# Patient Record
Sex: Male | Born: 1975 | Race: Black or African American | Hispanic: No | Marital: Married | Smoking: Former smoker
Health system: Southern US, Community
[De-identification: ages and names within clinical notes are randomized; demographics above are authoritative.]

## PROBLEM LIST (undated history)

## (undated) DIAGNOSIS — K219 Gastro-esophageal reflux disease without esophagitis: Secondary | ICD-10-CM

## (undated) DIAGNOSIS — E785 Hyperlipidemia, unspecified: Secondary | ICD-10-CM

## (undated) DIAGNOSIS — N289 Disorder of kidney and ureter, unspecified: Secondary | ICD-10-CM

## (undated) DIAGNOSIS — I10 Essential (primary) hypertension: Secondary | ICD-10-CM

---

## 2004-04-22 ENCOUNTER — Emergency Department: Payer: Self-pay | Admitting: Emergency Medicine

## 2012-03-20 ENCOUNTER — Emergency Department: Payer: Self-pay | Admitting: Emergency Medicine

## 2012-03-20 LAB — CBC
HGB: 13.8 g/dL (ref 13.0–18.0)
MCV: 91 fL (ref 80–100)
Platelet: 235 10*3/uL (ref 150–440)
RBC: 4.45 10*6/uL (ref 4.40–5.90)
RDW: 14.6 % — ABNORMAL HIGH (ref 11.5–14.5)
WBC: 6.1 10*3/uL (ref 3.8–10.6)

## 2012-03-20 LAB — TROPONIN I: Troponin-I: <0.02 ng/mL

## 2012-03-20 LAB — BASIC METABOLIC PANEL
Anion Gap: 8 (ref 7–16)
Calcium, Total: 9.5 mg/dL (ref 8.5–10.1)
Co2: 28 mmol/L (ref 21–32)
EGFR (African American): >60
EGFR (Non-African Amer.): >60
Glucose: 91 mg/dL (ref 65–99)
Osmolality: 280 (ref 275–301)

## 2014-08-15 ENCOUNTER — Encounter (HOSPITAL_COMMUNITY): Payer: Self-pay | Admitting: Emergency Medicine

## 2014-08-15 ENCOUNTER — Emergency Department (HOSPITAL_COMMUNITY): Payer: 59

## 2014-08-15 ENCOUNTER — Emergency Department (HOSPITAL_COMMUNITY)
Admission: EM | Admit: 2014-08-15 | Discharge: 2014-08-16 | Disposition: A | Discharge status: Home / Self Care | Payer: 59 | Attending: Emergency Medicine | Admitting: Emergency Medicine

## 2014-08-15 DIAGNOSIS — Z8719 Personal history of other diseases of the digestive system: Secondary | ICD-10-CM | POA: Insufficient documentation

## 2014-08-15 DIAGNOSIS — R079 Chest pain, unspecified: Secondary | ICD-10-CM | POA: Insufficient documentation

## 2014-08-15 DIAGNOSIS — M791 Myalgia: Secondary | ICD-10-CM | POA: Diagnosis not present

## 2014-08-15 DIAGNOSIS — R197 Diarrhea, unspecified: Secondary | ICD-10-CM | POA: Insufficient documentation

## 2014-08-15 DIAGNOSIS — I10 Essential (primary) hypertension: Secondary | ICD-10-CM | POA: Insufficient documentation

## 2014-08-15 HISTORY — DX: Essential (primary) hypertension: I10

## 2014-08-15 HISTORY — DX: Gastro-esophageal reflux disease without esophagitis: K21.9

## 2014-08-15 LAB — CBC
HCT: 43.2 % (ref 39.0–52.0)
Hemoglobin: 14.4 g/dL (ref 13.0–17.0)
MCH: 30.6 pg (ref 26.0–34.0)
MCHC: 33.3 g/dL (ref 30.0–36.0)
MCV: 91.9 fL (ref 78.0–100.0)
PLATELETS: 221 10*3/uL (ref 150–400)
RBC: 4.7 MIL/uL (ref 4.22–5.81)
RDW: 14.6 % (ref 11.5–15.5)
WBC: 8.9 10*3/uL (ref 4.0–10.5)

## 2014-08-15 MED ORDER — IBUPROFEN 800 MG PO TABS
800.0000 mg | ORAL_TABLET | Freq: Once | ORAL | Status: AC
  Administered 2014-08-15: 800 mg via ORAL
  Filled 2014-08-15: qty 1

## 2014-08-15 NOTE — ED Notes (Signed)
Pt feels like a knot in his chest, getting worse this evening, fever 100.9, and feeling weak

## 2014-08-15 NOTE — ED Provider Notes (Signed)
CSN: [id number]     Arrival date & time 08/15/14  2246 History  This chart was scribed for Joya Gaskinsonald W Arshad Oberholzer, MD by Gwenyth Oberatherine Macek, ED Scribe. This patient was seen in room APA14/APA14 and the patient's care was started at 11:29 PM.    Chief Complaint  Patient presents with  . Chest Pain   Patient is a 39 y.o. male presenting with chest pain. The history is provided by the patient. No language interpreter was used.  Chest Pain Pain location:  Substernal area Pain quality: tightness   Pain radiates to:  Does not radiate Pain radiates to the back: no   Pain severity:  Moderate Onset quality:  Sudden Duration:  7 hours Timing:  Constant Progression:  Unchanged Chronicity:  New Context: breathing   Relieved by:  None tried Worsened by:  Deep breathing Ineffective treatments:  Rest Associated symptoms: no abdominal pain, no cough, no shortness of breath and not vomiting     HPI Comments: Austin Rochdward Lira is a 39 y.o. male with a history of HTN and high cholesterol who presents to the Emergency Department complaining of constant, non-radiating, acute onset, 7.5/10 chest tightness that started 7 hours ago. He describes symptoms as "an extreme winded sensation" and "a chicken bone lodged in my chest." Pt reports generalized weakness and diarrhea that started last night as associated symptoms. He also notes prior cough/cold symptoms that improved 2 days ago, but returned today with body aches but no cough today. Pt states pain becomes worse with deep breaths, but does not change with exertion. He denies abnormal food intake, recent travel, a family history of CAD and a history of similar pain. Pt denies history of MI, CVA, PE/DVT and leg swelling. He also denies abdominal pain, vomiting and cough as associated symptoms.  He denies exertional CP He denies dyspnea on exertion Past Medical History  Diagnosis Date  . Hypertension   . GERD (gastroesophageal reflux disease)    History reviewed. No  pertinent past surgical history. No family history on file. History  Substance Use Topics  . Smoking status: Never Smoker   . Smokeless tobacco: Not on file  . Alcohol Use: Not on file    Review of Systems  Respiratory: Negative for cough and shortness of breath.   Cardiovascular: Positive for chest pain.  Gastrointestinal: Positive for diarrhea. Negative for vomiting and abdominal pain.  Musculoskeletal: Positive for myalgias.  Neurological: Negative for syncope.  All other systems reviewed and are negative.   Allergies  Review of patient's allergies indicates not on file.  Home Medications   Prior to Admission medications   Not on File   BP 133/86 mmHg  Pulse 77  Temp(Src) 98.8 F (37.1 C) (Oral)  Resp 25  Ht 6\' 2"  (1.88 m)  Wt 265 lb (120.203 kg)  BMI 34.01 kg/m2  SpO2 96% Physical Exam CONSTITUTIONAL: Well developed/well nourished HEAD: Normocephalic/atraumatic EYES: EOMI/PERRL ENMT: Mucous membranes moist NECK: supple no meningeal signs SPINE/BACK:entire spine nontender CV: S1/S2 noted, no murmurs/rubs/gallops noted LUNGS: Lungs are clear to auscultation bilaterally, no apparent distress ABDOMEN: soft, nontender, no rebound or guarding, bowel sounds noted throughout abdomen GU:no cva tenderness NEURO: Pt is awake/alert/appropriate, moves all extremitiesx4.  No facial droop.   EXTREMITIES: pulses normal/equal in all extremities, full ROM; no lower extremity edema or tenderness SKIN: warm, color normal PSYCH: no abnormalities of mood noted, alert and oriented to situation  ED Course  Procedures  DIAGNOSTIC STUDIES: Oxygen Saturation is 96% on RA, normal by  my interpretation.    COORDINATION OF CARE: 11:34 PM Discussed treatment plan with pt at bedside and pt agreed to plan.   Pt well appearing Vitals appropriate He appears PERC negative Low suspicion for ACS given history/exam (troponin sent at protocol prior to my eval) No evidence of acute aortic  dissection Pt reports he feels something is "stuck" in his chest and feels it may be esophageal related but no drooling is noted.   Denies odynophagia He appears appropriate for d/c home.  We discussed strict return precautions BP 131/83 mmHg  Pulse 74  Temp(Src) 98.5 F (36.9 C) (Oral)  Resp 23  Ht  (1.88 m)  Wt 265 lb (120.203 kg)  BMI 34.01 kg/m2  SpO2 99%   Labs Review Labs Reviewed  BASIC METABOLIC PANEL - Abnormal; Notable for the following:    Potassium 3.4 (*)    Creatinine, Ser 1.45 (*)    GFR calc non Af Amer 60 (*)    GFR calc Af Amer 69 (*)    All other components within normal limits  CBC  TROPONIN I    Imaging Review Dg Chest 2 View  08/16/2014   CLINICAL DATA:  Abdomen and midchest pain for 1 day.  EXAM: CHEST  2 VIEW  COMPARISON:  12/31/2009  FINDINGS: The heart size and mediastinal contours are within normal limits. Both lungs are clear. The visualized skeletal structures are unremarkable.  IMPRESSION: No active cardiopulmonary disease.   Electronically Signed   By: Ellery Plunk M.D.   On: 08/16/2014 00:11     EKG Interpretation   Date/Time:  Friday August 15 2014 22:57:10 EST Ventricular Rate:  90 PR Interval:  190 QRS Duration: 87 QT Interval:  358 QTC Calculation: 438 R Axis:   21 Text Interpretation:  Sinus rhythm ST elev, probable normal early repol  pattern Confirmed by ZAMMIT  MD, JOSEPH [phone]) on 08/15/2014 11:02:11 PM      MDM   Final diagnoses:  Chest pain, unspecified chest pain type    Nursing notes including past medical history and social history reviewed and considered in documentation xrays/imaging reviewed by myself and considered during evaluation Labs/vital reviewed myself and considered during evaluation   I personally performed the services described in this documentation, which was scribed in my presence. The recorded information has been reviewed and is accurate.      Joya Gaskins, MD 08/16/14  [phone]

## 2014-08-16 LAB — BASIC METABOLIC PANEL
ANION GAP: 5 (ref 5–15)
BUN: 15 mg/dL (ref 6–23)
CALCIUM: 8.7 mg/dL (ref 8.4–10.5)
CO2: 29 mmol/L (ref 19–32)
CREATININE: 1.45 mg/dL — AB (ref 0.50–1.35)
Chloride: 102 mmol/L (ref 96–112)
GFR calc Af Amer: 69 mL/min — ABNORMAL LOW (ref >90)
GFR, EST NON AFRICAN AMERICAN: 60 mL/min — AB (ref >90)
Glucose, Bld: 95 mg/dL (ref 70–99)
Potassium: 3.4 mmol/L — ABNORMAL LOW (ref 3.5–5.1)
Sodium: 136 mmol/L (ref 135–145)

## 2014-08-16 LAB — TROPONIN I: Troponin I: <0.03 ng/mL (ref <0.031)

## 2014-08-16 NOTE — Discharge Instructions (Signed)

## 2014-08-16 NOTE — ED Notes (Signed)
Discharge instructions given, pt demonstrated teach back and verbal understanding. No concerns voiced.  

## 2015-05-14 ENCOUNTER — Ambulatory Visit (HOSPITAL_COMMUNITY)
Admission: RE | Admit: 2015-05-14 | Discharge: 2015-05-14 | Disposition: A | Discharge status: Home / Self Care | Payer: 59 | Source: Ambulatory Visit | Attending: Physician Assistant | Admitting: Physician Assistant

## 2015-05-14 ENCOUNTER — Other Ambulatory Visit (HOSPITAL_COMMUNITY): Payer: Self-pay | Admitting: Physician Assistant

## 2015-05-14 DIAGNOSIS — N433 Hydrocele, unspecified: Secondary | ICD-10-CM | POA: Diagnosis not present

## 2015-05-14 DIAGNOSIS — N5082 Scrotal pain: Secondary | ICD-10-CM | POA: Insufficient documentation

## 2015-05-14 DIAGNOSIS — I861 Scrotal varices: Secondary | ICD-10-CM | POA: Diagnosis not present

## 2015-06-30 ENCOUNTER — Ambulatory Visit (INDEPENDENT_AMBULATORY_CARE_PROVIDER_SITE_OTHER): Payer: BLUE CROSS/BLUE SHIELD | Admitting: Urology

## 2015-06-30 DIAGNOSIS — K4090 Unilateral inguinal hernia, without obstruction or gangrene, not specified as recurrent: Secondary | ICD-10-CM

## 2015-08-03 ENCOUNTER — Other Ambulatory Visit (HOSPITAL_COMMUNITY): Payer: Self-pay | Admitting: Nephrology

## 2015-08-03 DIAGNOSIS — N183 Chronic kidney disease, stage 3 unspecified: Secondary | ICD-10-CM

## 2015-08-20 ENCOUNTER — Ambulatory Visit (HOSPITAL_COMMUNITY): Payer: BLUE CROSS/BLUE SHIELD

## 2015-08-20 ENCOUNTER — Ambulatory Visit (HOSPITAL_COMMUNITY)
Admission: RE | Admit: 2015-08-20 | Discharge: 2015-08-20 | Disposition: A | Discharge status: Home / Self Care | Payer: BLUE CROSS/BLUE SHIELD | Source: Ambulatory Visit | Attending: Nephrology | Admitting: Nephrology

## 2015-08-20 DIAGNOSIS — N183 Chronic kidney disease, stage 3 unspecified: Secondary | ICD-10-CM

## 2015-08-20 DIAGNOSIS — I129 Hypertensive chronic kidney disease with stage 1 through stage 4 chronic kidney disease, or unspecified chronic kidney disease: Secondary | ICD-10-CM | POA: Insufficient documentation

## 2015-12-25 IMAGING — US US ART/VEN ABD/PELV/SCROTUM DOPPLER LTD
1 series · 14 of 25 positions shown · non-contrast
Comparison: None.

CLINICAL DATA: Scrotal pain left greater than right

EXAM:
SCROTAL ULTRASOUND
DOPPLER ULTRASOUND OF THE TESTICLES
TECHNIQUE: Complete ultrasound examination of the testicles, epididymis, and
other scrotal structures was performed. Color and spectral Doppler
ultrasound were also utilized to evaluate blood flow to the
testicles.

[Series 1: us art/ven abd/pelv/scrotum doppler ltd · 0.06mm/px · 14 of 47 slices shown]
[im 1/47]
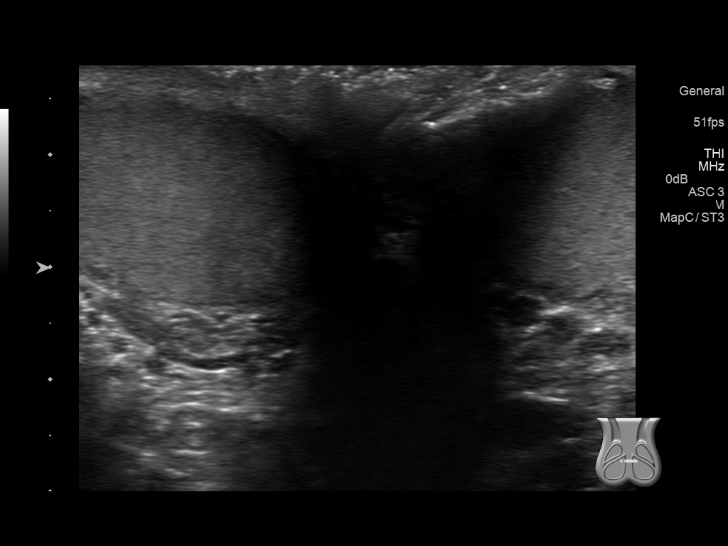
[im 4/47]
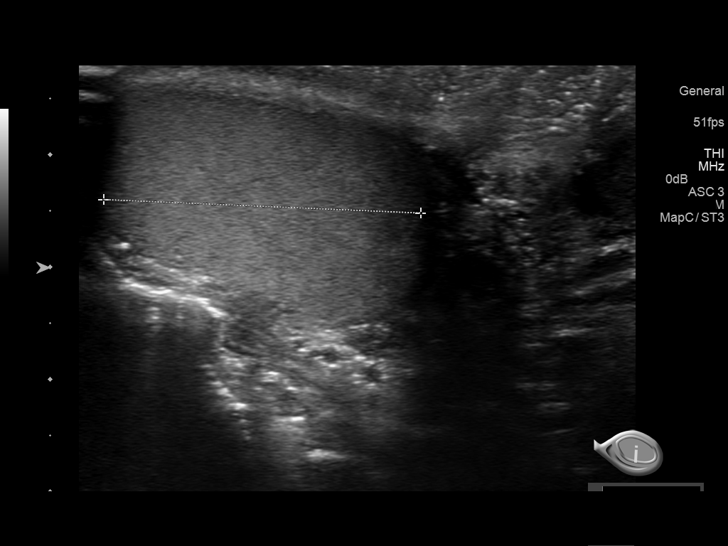
[im 8/47]
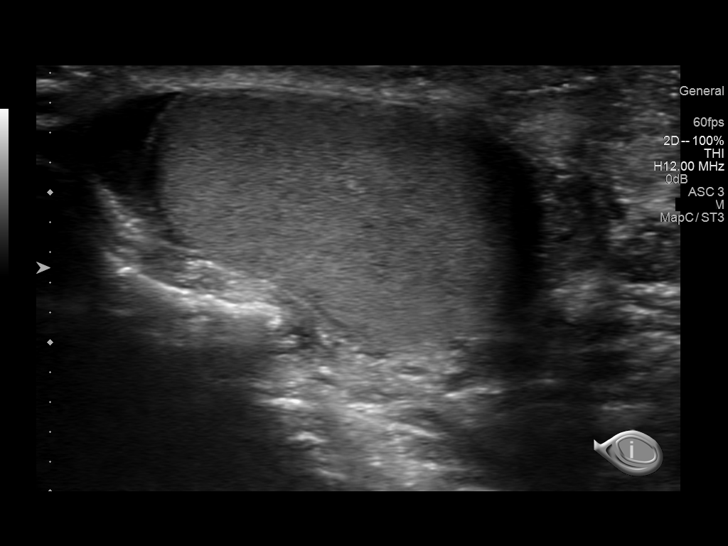
[im 12/47]
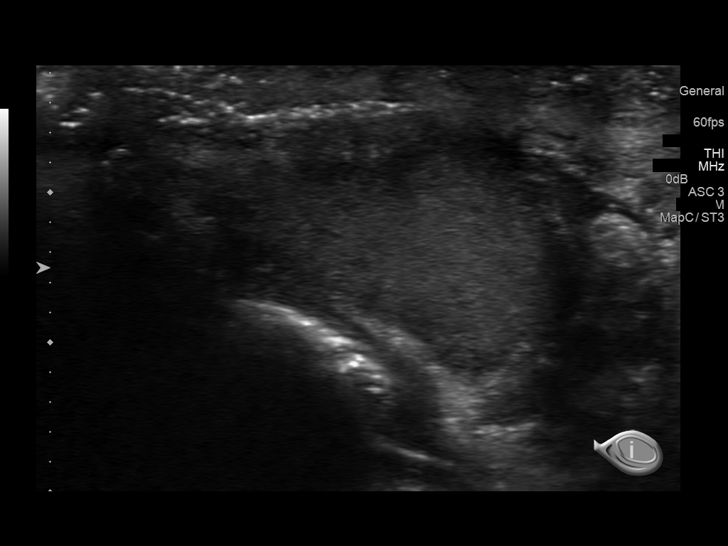
[im 16/47]
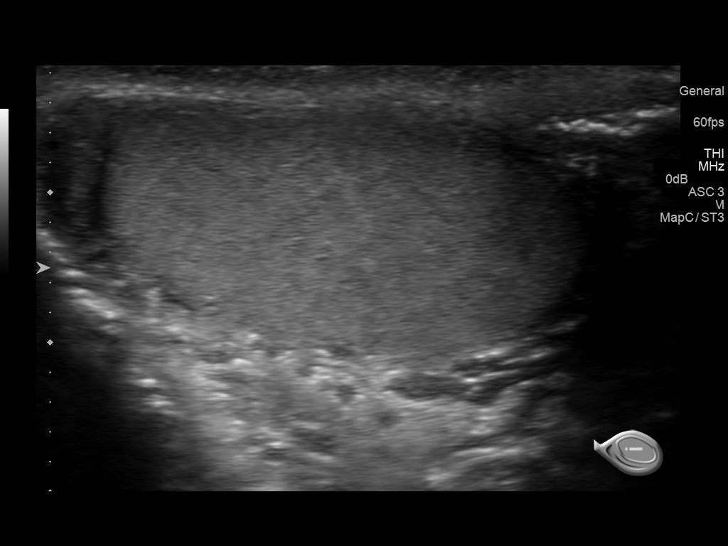
[im 18/47]
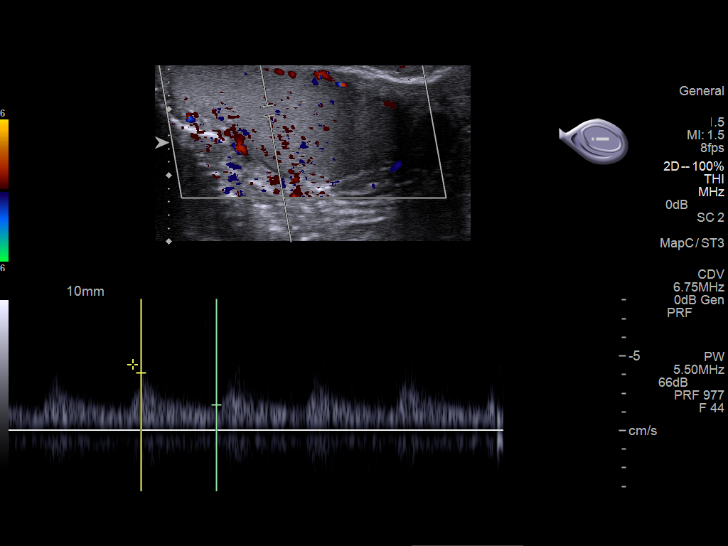
[im 22/47]
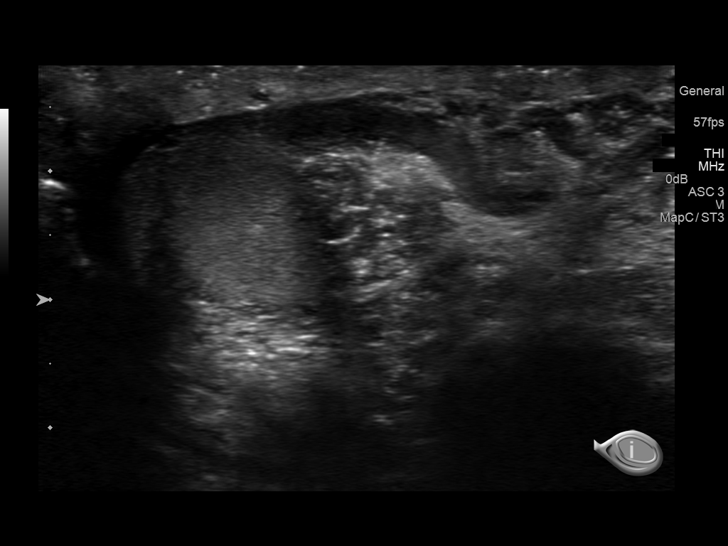
[im 25/47]
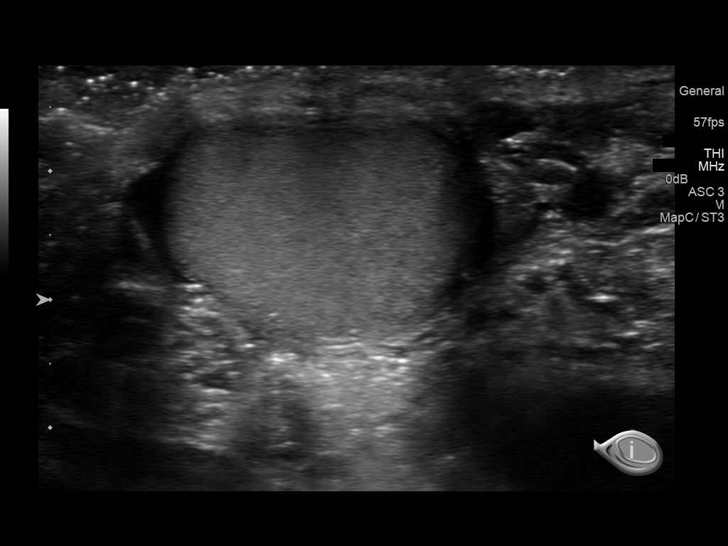
[im 29/47]
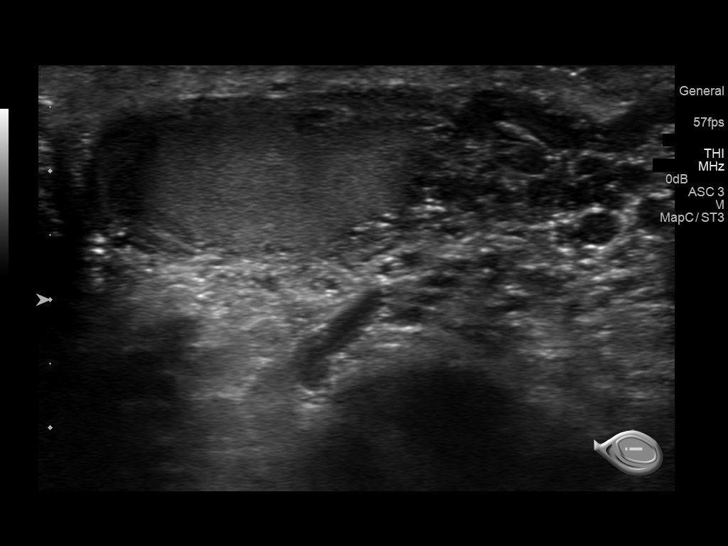
[im 31/47]
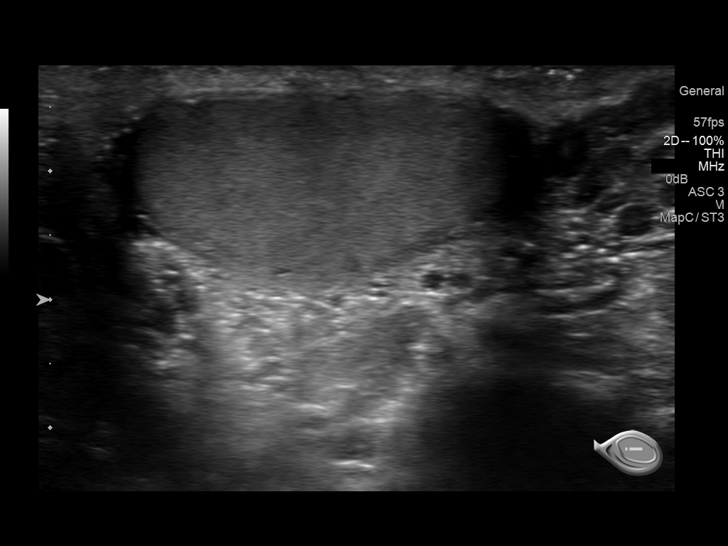
[im 35/47]
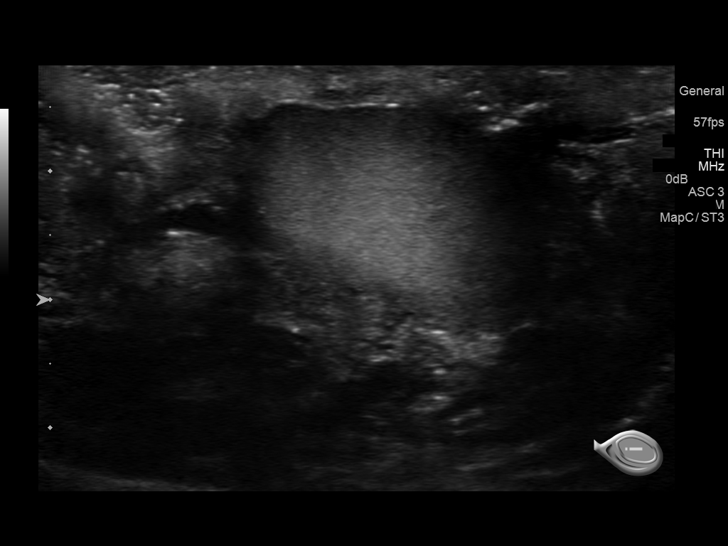
[im 39/47]
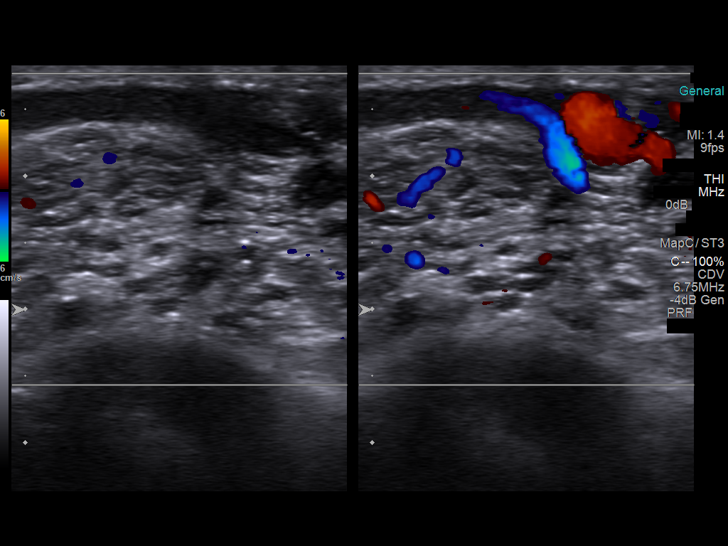
[im 43/47]
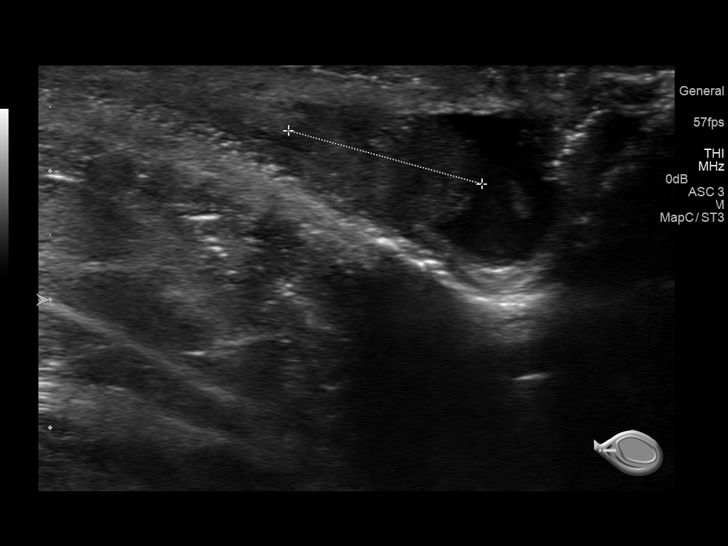
[im 47/47]
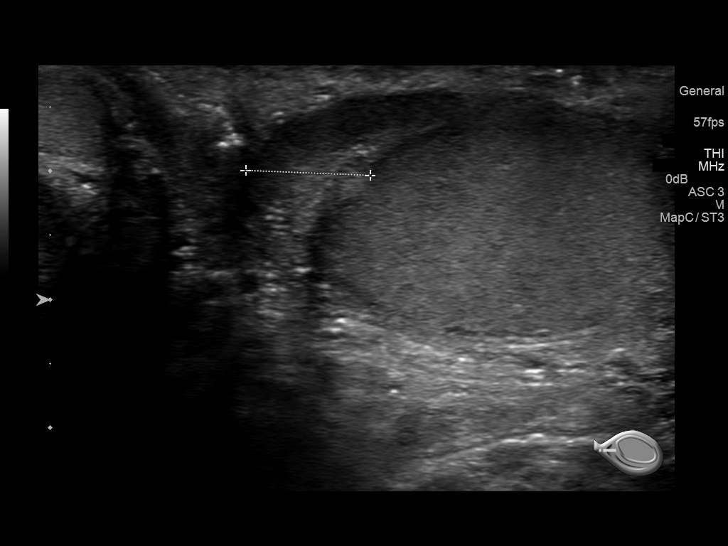

[14 of 25 positions shown; findings below may reference images not displayed]

FINDINGS: Right testicle

Measurements: 3.5 x 1.7 x 2.8 cm.. No mass or microlithiasis
visualized.

Left testicle

Measurements: 3.3 x 1.8 x 2.2 cm.. No mass or microlithiasis
visualized.

Right epididymis:  Normal in size and appearance.

Left epididymis:  Normal in size and appearance.

Hydrocele:  Small right hydrocele is noted.

Varicocele:  Small left varicocele is seen.

Pulsed Doppler interrogation of both testes demonstrates normal low
resistance arterial and venous waveforms bilaterally.
IMPRESSION: Normal-appearing testicles bilaterally.

Small left varicocele and left hydrocele are noted.

## 2016-04-01 IMAGING — US US RENAL
1 series · 14 of 25 positions shown · non-contrast
Comparison: Ultrasound the kidneys of 11/20/2009

CLINICAL DATA: Chronic kidney disease, stage III.  Hypertension.

EXAM:
RENAL / URINARY TRACT ULTRASOUND COMPLETE

[Series 1: us renal · 0.24mm/px · 14 of 57 slices shown]
[im 1/57]
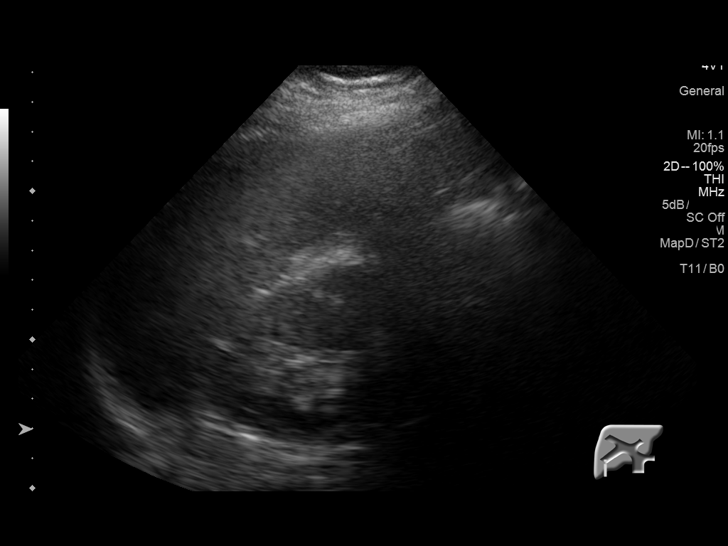
[im 5/57]
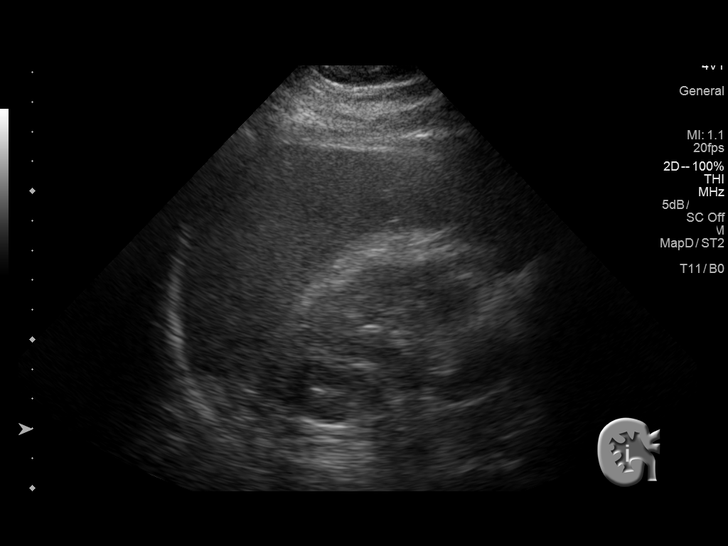
[im 10/57]
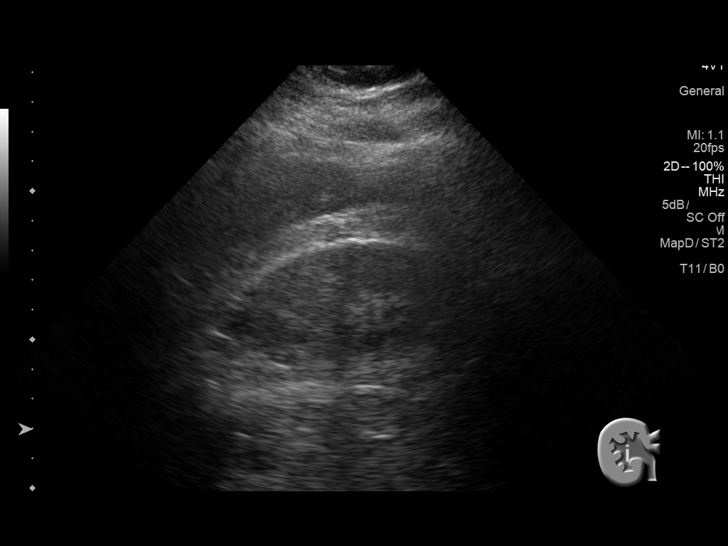
[im 15/57]
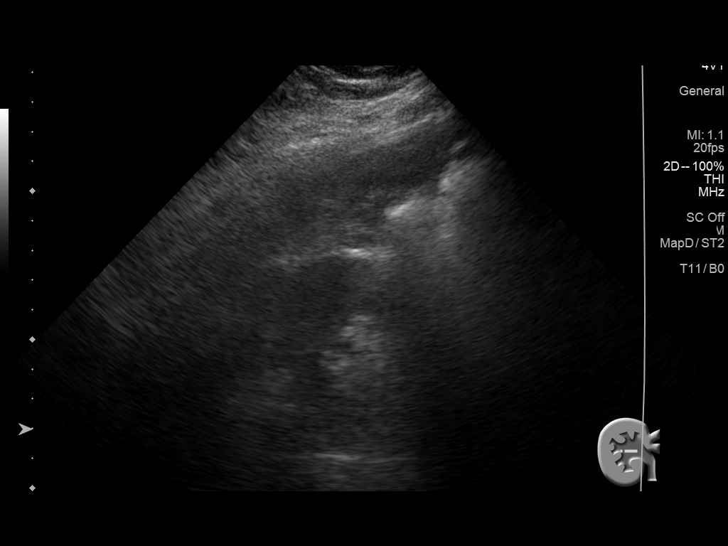
[im 19/57]
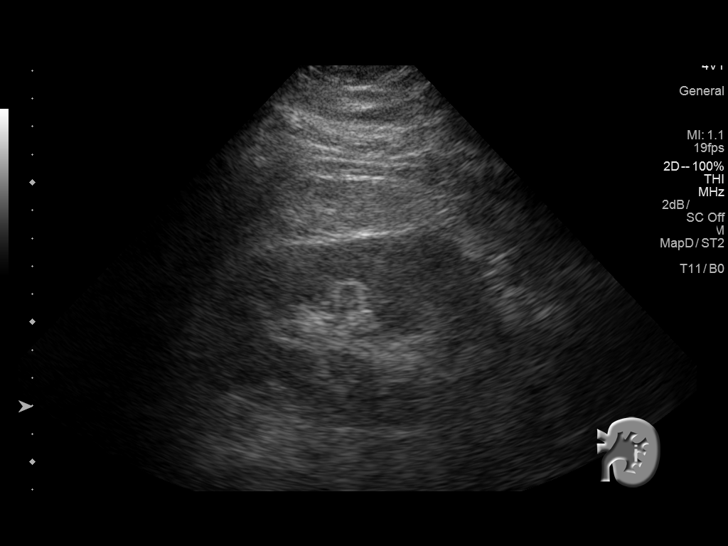
[im 22/57]
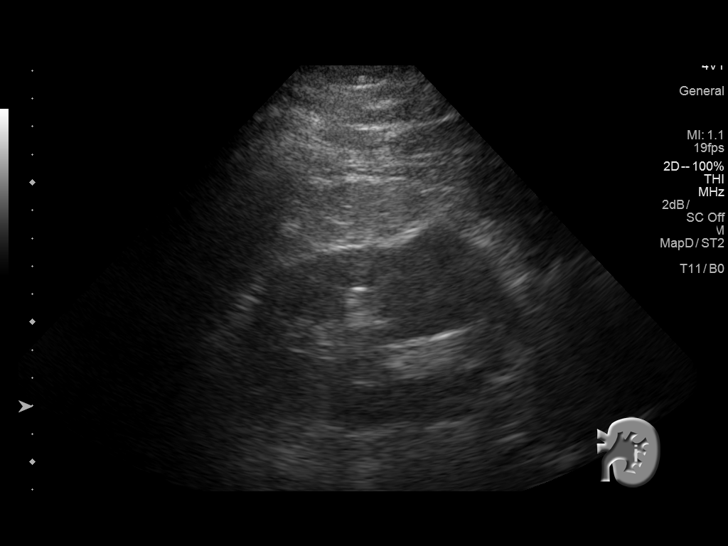
[im 26/57]
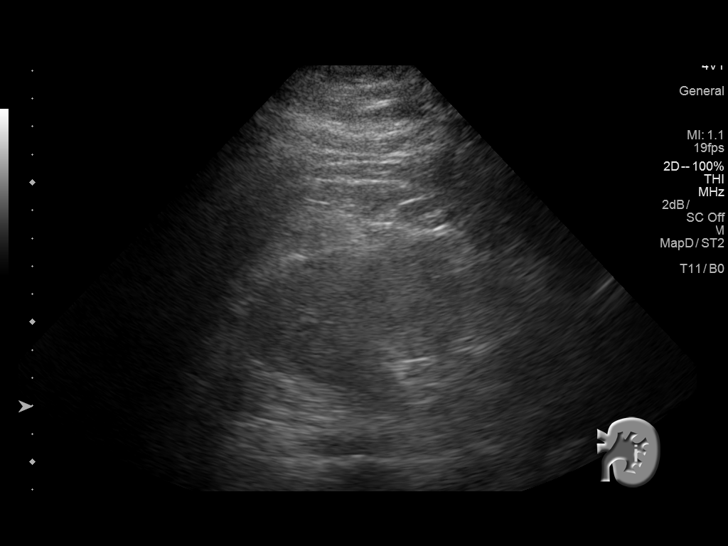
[im 31/57]
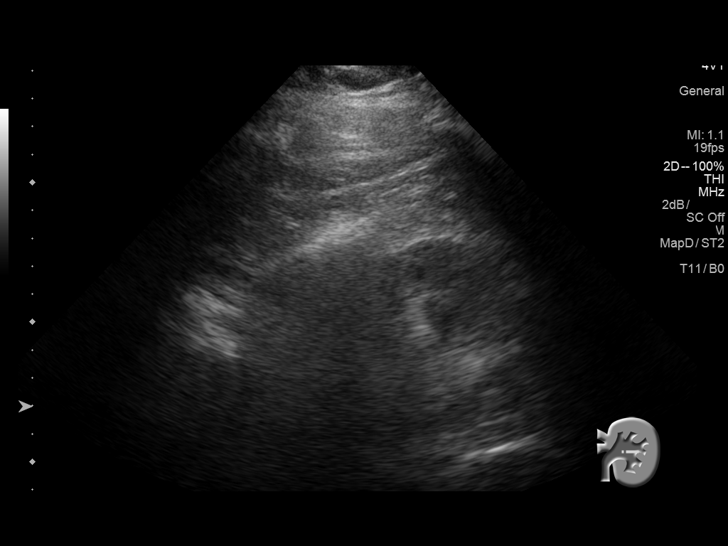
[im 36/57]
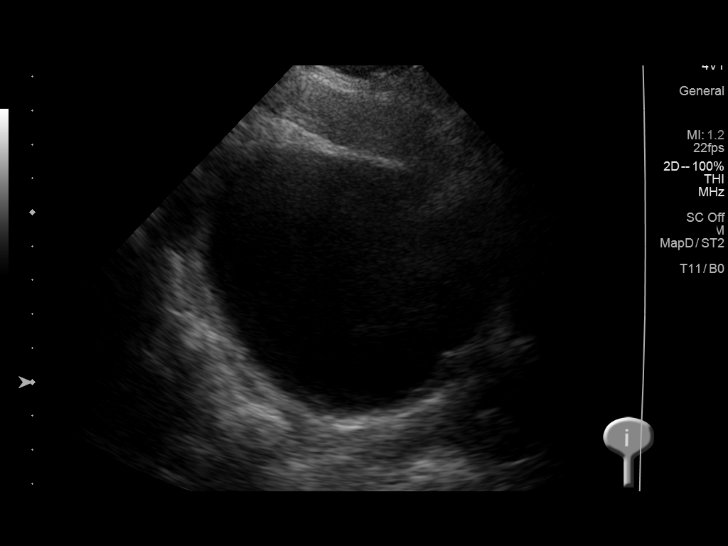
[im 38/57]
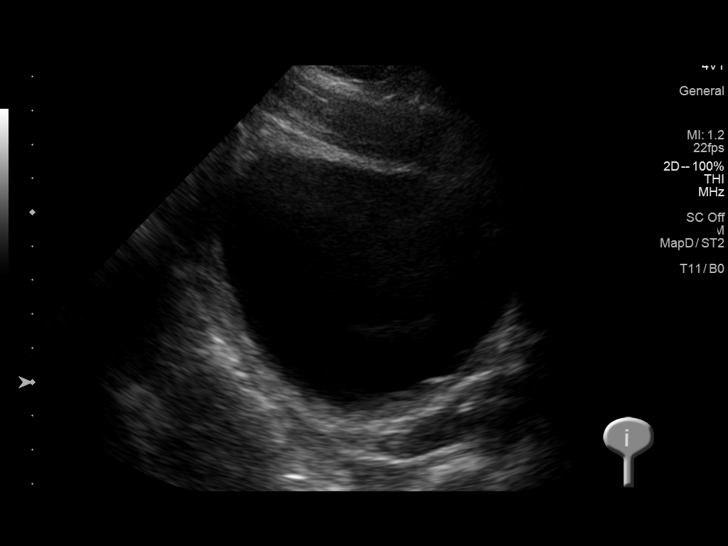
[im 43/57]
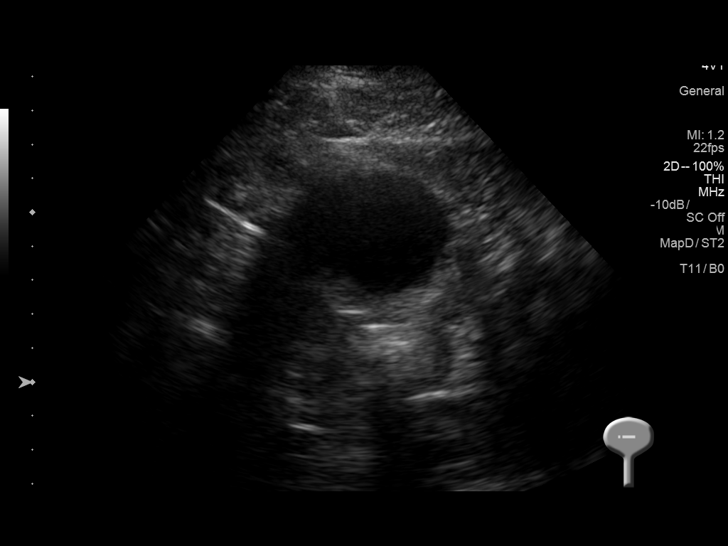
[im 47/57]
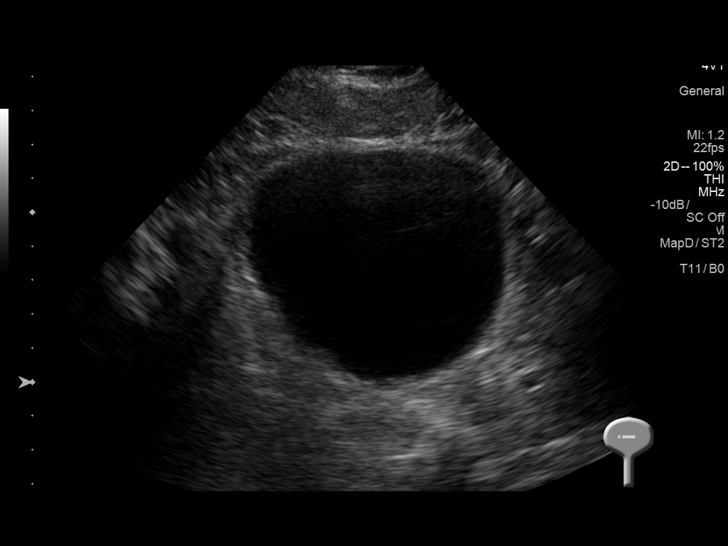
[im 52/57]
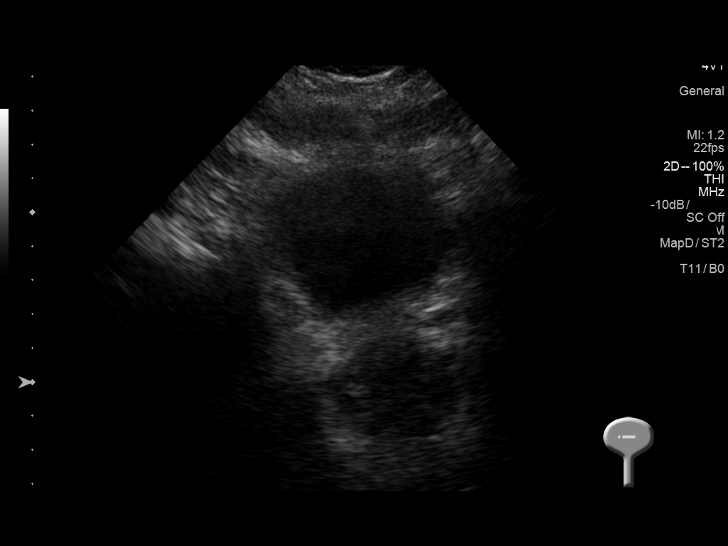
[im 57/57]
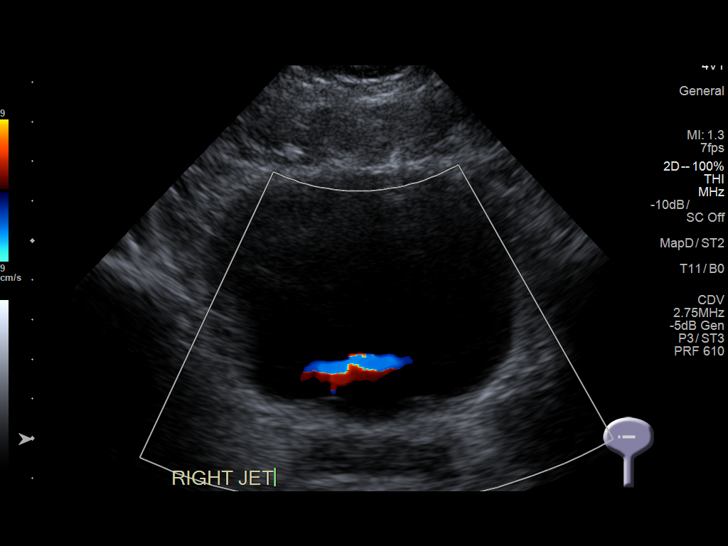

[14 of 25 positions shown; findings below may reference images not displayed]

FINDINGS: Right Kidney:

Length: 11.1 cm.. No hydronephrosis is seen. The echogenicity of the
right kidney may be slightly increased.

Left Kidney:

Length: 11.5 cm.. No hydronephrosis is noted. The echogenicity of
the left kidney may also be slightly increased.

Bladder:

The urinary bladder is unremarkable. Bilateral ureteral jets are
visualized.
IMPRESSION: 1. No hydronephrosis.
2. Slightly increased renal parenchyma echogenicity consistent with
chronic renal medical disease.

## 2016-05-17 ENCOUNTER — Ambulatory Visit (INDEPENDENT_AMBULATORY_CARE_PROVIDER_SITE_OTHER): Payer: BLUE CROSS/BLUE SHIELD | Admitting: Urology

## 2016-05-17 DIAGNOSIS — N468 Other male infertility: Secondary | ICD-10-CM | POA: Diagnosis not present

## 2016-12-15 ENCOUNTER — Emergency Department: Payer: Self-pay

## 2016-12-15 ENCOUNTER — Encounter: Payer: Self-pay | Admitting: Emergency Medicine

## 2016-12-15 ENCOUNTER — Emergency Department
Admission: EM | Admit: 2016-12-15 | Discharge: 2016-12-15 | Disposition: A | Discharge status: Home / Self Care | Payer: Self-pay | Attending: Emergency Medicine | Admitting: Emergency Medicine

## 2016-12-15 DIAGNOSIS — R079 Chest pain, unspecified: Secondary | ICD-10-CM

## 2016-12-15 DIAGNOSIS — I10 Essential (primary) hypertension: Secondary | ICD-10-CM | POA: Insufficient documentation

## 2016-12-15 DIAGNOSIS — R0789 Other chest pain: Secondary | ICD-10-CM | POA: Insufficient documentation

## 2016-12-15 HISTORY — DX: Disorder of kidney and ureter, unspecified: N28.9

## 2016-12-15 HISTORY — DX: Hyperlipidemia, unspecified: E78.5

## 2016-12-15 LAB — CBC
HCT: 41.5 % (ref 40.0–52.0)
Hemoglobin: 13.8 g/dL (ref 13.0–18.0)
MCH: 30 pg (ref 26.0–34.0)
MCHC: 33.2 g/dL (ref 32.0–36.0)
MCV: 90.5 fL (ref 80.0–100.0)
PLATELETS: 253 10*3/uL (ref 150–440)
RBC: 4.59 MIL/uL (ref 4.40–5.90)
RDW: 15 % — ABNORMAL HIGH (ref 11.5–14.5)
WBC: 8 10*3/uL (ref 3.8–10.6)

## 2016-12-15 LAB — BASIC METABOLIC PANEL
ANION GAP: 7 (ref 5–15)
BUN: 20 mg/dL (ref 6–20)
CALCIUM: 9.7 mg/dL (ref 8.9–10.3)
CHLORIDE: 100 mmol/L — AB (ref 101–111)
CO2: 28 mmol/L (ref 22–32)
CREATININE: 1.3 mg/dL — AB (ref 0.61–1.24)
GFR calc Af Amer: >60 mL/min (ref >60)
GFR calc non Af Amer: >60 mL/min (ref >60)
Glucose, Bld: 112 mg/dL — ABNORMAL HIGH (ref 65–99)
Potassium: 3.4 mmol/L — ABNORMAL LOW (ref 3.5–5.1)
Sodium: 135 mmol/L (ref 135–145)

## 2016-12-15 LAB — TROPONIN I: Troponin I: <0.03 ng/mL (ref <0.03)

## 2016-12-15 MED ORDER — LORAZEPAM 2 MG/ML IJ SOLN
0.5000 mg | Freq: Once | INTRAMUSCULAR | Status: AC
  Administered 2016-12-15: 0.5 mg via INTRAVENOUS
  Filled 2016-12-15: qty 1

## 2016-12-15 NOTE — ED Notes (Signed)

## 2016-12-15 NOTE — ED Triage Notes (Signed)
Pt presents to ED 07 c/o chest pain that started around 1515 today while climbing steps as exercise at work. Pt states pain was at 3/10; per EMS pt was given nitro spray and aspirin; at this time pt reports no pain; pt is awake alert and oriented x4. Per EMS pt's VS were CBG 100, BP 117/78, EKG was NSR. Pt has hx of HTN, hyperlipidemia, and stage II kidney disease.

## 2016-12-15 NOTE — ED Provider Notes (Addendum)
[facility] Emergency Department Provider Note       Time seen: ----------------------------------------- 4:43 PM on 12/15/2016 -----------------------------------------     I have reviewed the triage vital signs and the nursing notes.   HISTORY   Chief Complaint Chest Pain    HPI Austin Carroll is a 41 y.o. male who presents to the ED for chest pain that began around 3:15. Patient reports he was exercising and going up and down flights of stairs. He describes a left-sided chest discomfort. He also has some discomfort with breathing. He reports she's only oatmeal today and otherwise has not had anything to eat. He has not had any other associated symptoms, nothing makes his symptoms better. He did receive aspirin and nitroglycerin in route by EMS without significant improvement in his symptoms. He has had this happen before and has been referred to cardiology for stress testing.   Past Medical History:  Diagnosis Date  . GERD (gastroesophageal reflux disease)   . Hypertension     There are no active problems to display for this patient.   No past surgical history on file.  Allergies Patient has no known allergies.  Social History Social History  Substance Use Topics  . Smoking status: Never Smoker  . Smokeless tobacco: Not on file  . Alcohol use Not on file    Review of Systems Constitutional: Negative for fever. Eyes: Negative for vision changes ENT:  Negative for congestion, sore throat Cardiovascular: Positive for chest pain Respiratory: Negative for shortness of breath. Gastrointestinal: Negative for abdominal pain, vomiting and diarrhea. Genitourinary: Negative for dysuria. Musculoskeletal: Negative for back pain. Skin: Negative for rash. Neurological: Negative for headaches, focal weakness or numbness.  All systems negative/normal/unremarkable except as stated in the  HPI  ____________________________________________   PHYSICAL EXAM:  VITAL SIGNS: ED Triage Vitals  Enc Vitals Group     BP --      Pulse --      Resp --      Temp --      Temp src --      SpO2 12/15/16 1637 100 %     Weight 12/15/16 1640 284 lb 14.4 oz (129.2 kg)     Height 12/15/16 1640 6\' 2"  (1.88 m)     Head Circumference --      Peak Flow --      Pain Score 12/15/16 1640 0     Pain Loc --      Pain Edu? --      Excl. in GC? --     Constitutional: Alert and oriented. Well appearing and in no distress. Eyes: Conjunctivae are normal. Normal extraocular movements. ENT   Head: Normocephalic and atraumatic.   Nose: No congestion/rhinnorhea.   Mouth/Throat: Mucous membranes are moist.   Neck: No stridor. Cardiovascular: Normal rate, regular rhythm. No murmurs, rubs, or gallops. Respiratory: Normal respiratory effort without tachypnea nor retractions. Breath sounds are clear and equal bilaterally. No wheezes/rales/rhonchi. Gastrointestinal: Soft and nontender. Normal bowel sounds Musculoskeletal: Nontender with normal range of motion in extremities. No lower extremity tenderness nor edema. Neurologic:  Normal speech and language. No gross focal neurologic deficits are appreciated.  Skin:  Skin is warm, dry and intact. No rash noted. Psychiatric: Mood and affect are normal. Speech and behavior are normal.  ____________________________________________  EKG: Interpreted by me.Sinus rhythm rate of 84 bpm, normal PR interval, normal QRS, normal QT, Normal axis, likely early repolarization not significantly changed from prior  ____________________________________________  ED COURSE:  Pertinent labs & imaging results that were available during my care of the patient were reviewed by me and considered in my medical decision making (see chart for details). Patient presents for chest pain, we will assess with labs and imaging as indicated.    Procedures ____________________________________________   LABS (pertinent positives/negatives)  Labs Reviewed  BASIC METABOLIC PANEL - Abnormal; Notable for the following:       Result Value   Potassium 3.4 (*)    Chloride 100 (*)    Glucose, Bld 112 (*)    Creatinine, Ser 1.30 (*)    All other components within normal limits  CBC - Abnormal; Notable for the following:    RDW 15.0 (*)    All other components within normal limits  TROPONIN I  TROPONIN I    RADIOLOGY  Chest x-ray IMPRESSION: No active disease. ____________________________________________  FINAL ASSESSMENT AND PLAN  Chest pain  Plan: Patient's labs and imaging were dictated above. Patient had presented for Chest pain which is likely multifactorial. Patient was exercising out in the heat when the symptoms started and likely got overheated, this then triggered anxiety and possibly chest pain. He does have some risk factors and will need close outpatient follow-up with cardiology again.   Emily Filbert, MD   Note: This note was generated in part or whole with voice recognition software. Voice recognition is usually quite accurate but there are transcription errors that can and very often do occur. I apologize for any typographical errors that were not detected and corrected.     Emily Filbert, MD 12/15/16 2013    Emily Filbert, MD 12/15/16 2018

## 2017-10-10 IMAGING — DX DG CHEST 1V PORT
1 series · 1 of 1 positions shown · non-contrast
Comparison: Chest radiograph 08/16/2014

CLINICAL DATA: Left-sided chest pain

EXAM:
PORTABLE CHEST 1 VIEW

[chest ap]
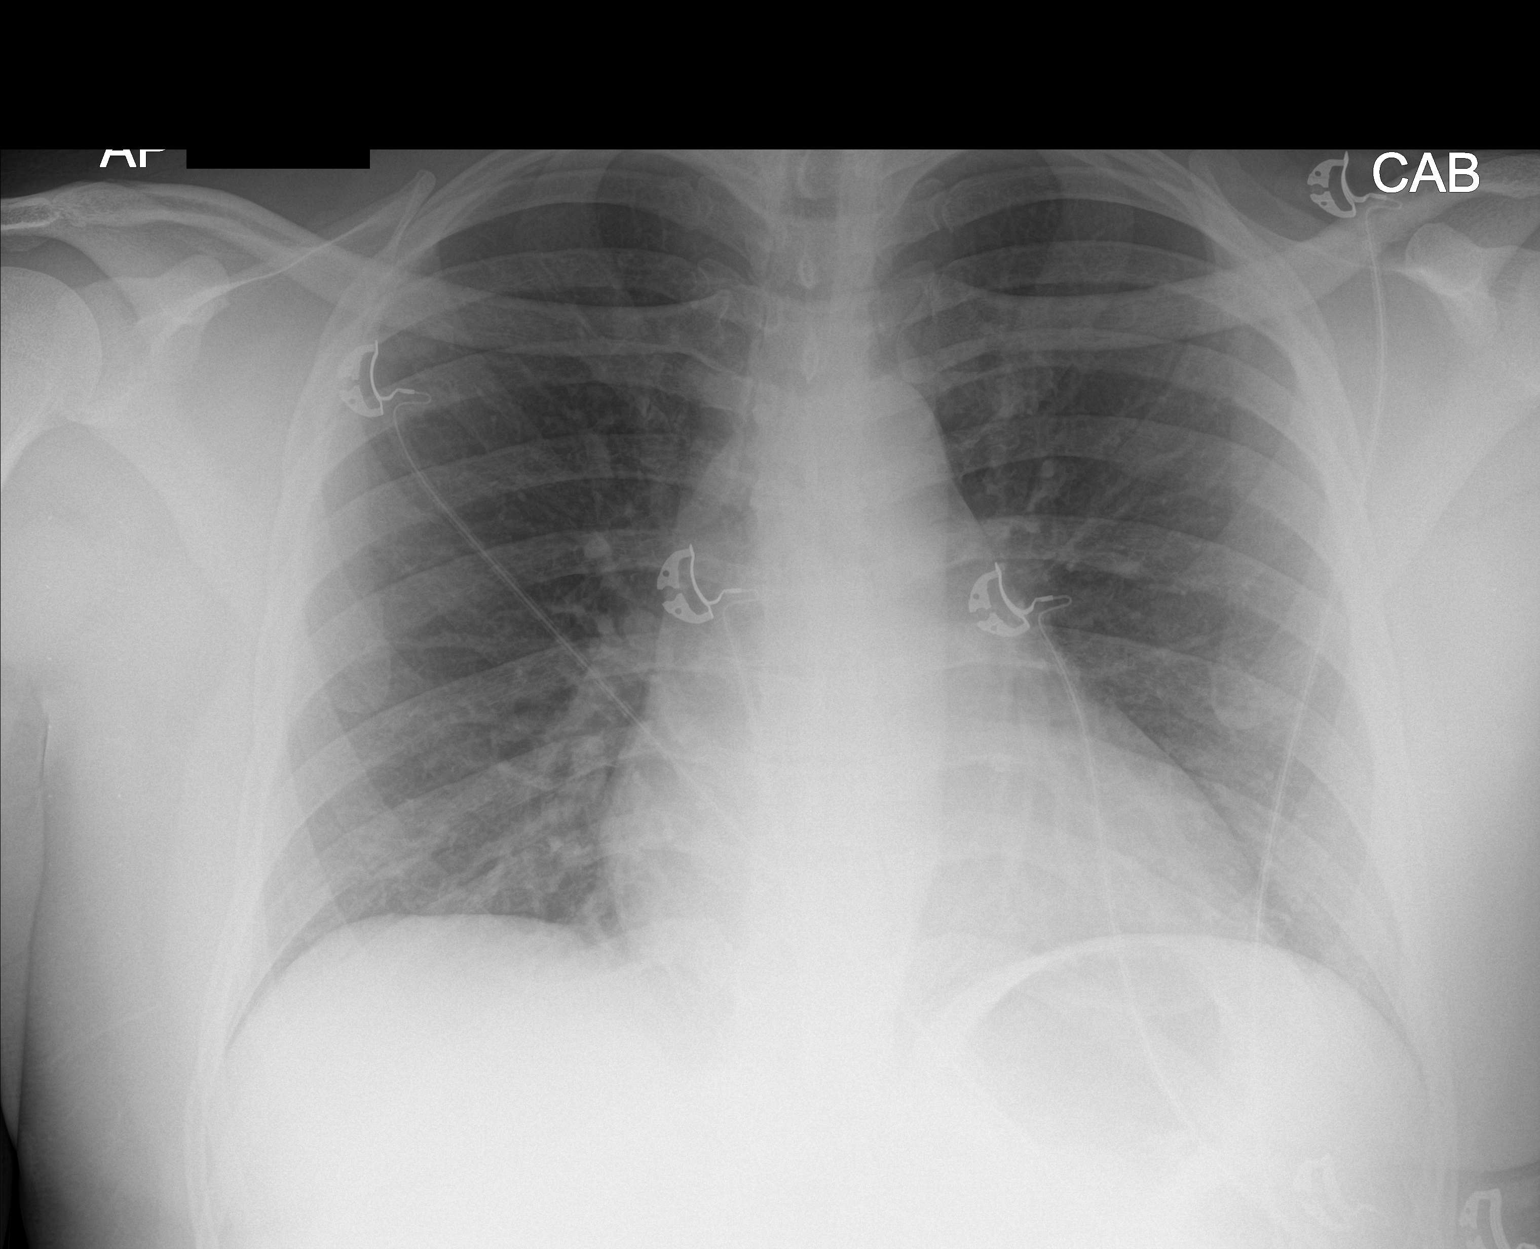

[1 of 1 positions shown; findings below may reference images not displayed]

FINDINGS: The heart size and mediastinal contours are within normal limits.
Both lungs are clear. The visualized skeletal structures are
unremarkable.
IMPRESSION: No active disease.

## 2020-12-29 ENCOUNTER — Other Ambulatory Visit: Payer: Self-pay

## 2020-12-29 ENCOUNTER — Ambulatory Visit
Admission: EM | Admit: 2020-12-29 | Discharge: 2020-12-29 | Disposition: A | Discharge status: Home / Self Care | Payer: BLUE CROSS/BLUE SHIELD

## 2020-12-29 ENCOUNTER — Telehealth: Payer: Self-pay

## 2020-12-29 DIAGNOSIS — M7021 Olecranon bursitis, right elbow: Secondary | ICD-10-CM | POA: Diagnosis not present

## 2020-12-29 MED ORDER — MELOXICAM 15 MG PO TABS: 15.0000 mg | ORAL_TABLET | Freq: Every day | ORAL | 0 refills | Status: DC

## 2020-12-29 MED ORDER — MELOXICAM 15 MG PO TABS: 15.0000 mg | ORAL_TABLET | Freq: Every day | ORAL | 0 refills | Status: AC

## 2020-12-29 NOTE — Discharge Instructions (Addendum)
BURSITIS: You have bursitis of the elbow.  It is a small inflamed bursa.  Should get better over the next 1 to 2 weeks with compression wrap and anti-inflammatory medication.  Also, he should try to avoid putting pressure on your elbows.  If it is not getting better over the next 1 to 2 weeks or you feel like the swelling is getting worse he started have pain, please go to [facility] walk-in urgent care [place] if no change in the next couple of weeks, please go to [facility] walk-in clinic and [facility] is a may be able to drain the fluid.  You have a condition requiring you to follow up with Orthopedics so please call one of the following office for appointment:   [facility] [address] [place], [place] [address] Phone: [phone]  [facility] [address], [place], [place] [address] Phone: [phone]

## 2020-12-29 NOTE — ED Provider Notes (Signed)
[facility]    CSN: [id number] Arrival date & time: 12/29/20  1609      History   Chief Complaint Chief Complaint  Patient presents with   Joint Swelling    Right elbow    HPI Austin Carroll is a 45 y.o. male presenting with approximately 1 week history of right elbow swelling.  He denies any associated pain, redness or bruising.  Denies any injury.  Patient states that he is on his computer and typing quite often and rests on his elbows.  He says the swelling has not gotten significantly worse.  He denies taking any medications or using any sort of compression wraps.  Patient denies any similar problems in the past.  He denies any numbness, weakness or tingling.  He does have full range of motion of his elbow.  No other complaints.  HPI  Past Medical History:  Diagnosis Date   GERD (gastroesophageal reflux disease)    Hyperlipidemia    Hypertension    Kidney disease     There are no problems to display for this patient.   History reviewed. No pertinent surgical history.     Home Medications    Prior to Admission medications   Medication Sig Start Date End Date Taking? Authorizing Provider  aspirin EC 81 MG tablet Take 81 mg by mouth every 6 (six) hours as needed.   Yes [provider]  atorvastatin (LIPITOR) 20 MG tablet Take 20 mg by mouth daily. 09/23/16  Yes [provider]  hydrochlorothiazide (HYDRODIURIL) 25 MG tablet Take by mouth daily. 07/27/15  Yes [provider]  losartan (COZAAR) 100 MG tablet Take 100 mg by mouth daily. 12/15/20  Yes [provider]  meloxicam (MOBIC) 15 MG tablet Take 1 tablet (15 mg total) by mouth daily. 12/29/20 01/28/21 Yes Eusebio Friendly B, PA-C  amLODipine (NORVASC) 5 MG tablet Take 5 mg by mouth daily. 12/15/20   [provider]  losartan-hydrochlorothiazide (HYZAAR) 100-25 MG tablet Take 1 tablet by mouth daily. 09/23/16   [provider]    Family History Family History   Problem Relation Age of Onset   Diabetes Mother    Hyperlipidemia Mother    Hypertension Mother    Asthma Mother    Hypertension Father    Hyperlipidemia Father    Asthma Father     Social History Social History   Tobacco Use   Smoking status: Former    Pack years: 0.00    Types: Cigarettes   Smokeless tobacco: Never  Vaping Use   Vaping Use: Never used  Substance Use Topics   Alcohol use: No   Drug use: No     Allergies   Patient has no known allergies.   Review of Systems Review of Systems  Constitutional:  Negative for fatigue and fever.  Musculoskeletal:  Positive for joint swelling. Negative for arthralgias.  Skin:  Negative for color change and wound.  Neurological:  Negative for weakness and numbness.    Physical Exam Triage Vital Signs ED Triage Vitals  Enc Vitals Group     BP 12/29/20 1736 (!) 148/93     Pulse Rate 12/29/20 1736 70     Resp 12/29/20 1736 16     Temp 12/29/20 1736 98.4 F (36.9 C)     Temp Source 12/29/20 1736 Oral     SpO2 12/29/20 1736 96 %     Weight 12/29/20 1733 283 lb (128.4 kg)     Height 12/29/20 1733  6\' 2"  (1.88 m)     Head Circumference --      Peak Flow --      Pain Score 12/29/20 1733 2     Pain Loc --      Pain Edu? --      Excl. in GC? --    No data found.  Updated Vital Signs BP (!) 148/93 (BP Location: Left Arm)   Pulse 70   Temp 98.4 F (36.9 C) (Oral)   Resp 16   Ht 6\' 2"  (1.88 m)   Wt 283 lb (128.4 kg)   SpO2 96%   BMI 36.34 kg/m       Physical Exam Vitals and nursing note reviewed.  Constitutional:      General: He is not in acute distress.    Appearance: Normal appearance. He is well-developed. He is not ill-appearing.  HENT:     Head: Normocephalic and atraumatic.  Eyes:     General: No scleral icterus.    Conjunctiva/sclera: Conjunctivae normal.  Cardiovascular:     Rate and Rhythm: Normal rate and regular rhythm.     Pulses: Normal pulses.  Pulmonary:     Effort: Pulmonary effort  is normal. No respiratory distress.  Abdominal:     Palpations: Abdomen is soft.  Musculoskeletal:     Cervical back: Neck supple.     Comments: RIGHT ELBOW: Mild to moderate swelling of the olecranon.  No tenderness.  No overlying erythema, warmth or ecchymosis.  No lacerations or lesions.  Full range of motion.  No bony tenderness.  5-5 strength bilateral upper extremities.  Skin:    General: Skin is warm and dry.  Neurological:     General: No focal deficit present.     Mental Status: He is alert. Mental status is at baseline.  Psychiatric:        Mood and Affect: Mood normal.        Behavior: Behavior normal.        Thought Content: Thought content normal.     UC Treatments / Results  Labs (all labs ordered are listed, but only abnormal results are displayed) Labs Reviewed - No data to display  EKG   Radiology No results found.  Procedures Procedures (including critical care time)  Medications Ordered in UC Medications - No data to display  Initial Impression / Assessment and Plan / UC Course  I have reviewed the triage vital signs and the nursing notes.  Pertinent labs & imaging results that were available during my care of the patient were reviewed by me and considered in my medical decision making (see chart for details).  45 year old male presenting for 1 week history of right elbow swelling.  Patient's clinical presentation consistent with olecranon bursitis.  Treating with meloxicam and compression wrap.  Also advised him to ice the area.  Advised to avoid putting pressure on his elbows.  Advised following up with [facility] for any worsening symptoms or if he is not better in the next 1 to 2 weeks as he may need to have the fluid drained.  Advised to follow-up sooner if he develops any redness, fever, pain or change in range of motion of joint.  Patient agrees.   Final Clinical Impressions(s) / UC Diagnoses   Final diagnoses:  Olecranon bursitis of right  elbow     Discharge Instructions      BURSITIS: You have bursitis of the elbow.  It is a small inflamed bursa.  Should get better  over the next 1 to 2 weeks with compression wrap and anti-inflammatory medication.  Also, he should try to avoid putting pressure on your elbows.  If it is not getting better over the next 1 to 2 weeks or you feel like the swelling is getting worse he started have pain, please go to [facility] walk-in urgent care [place] if no change in the next couple of weeks, please go to [facility] walk-in clinic and [facility] is a may be able to drain the fluid.  You have a condition requiring you to follow up with Orthopedics so please call one of the following office for appointment:   [facility] [address] [place], [place] [address] Phone: [phone]  [facility] [address], [place], [place] [address] Phone: [phone]      ED Prescriptions     Medication Sig Dispense Auth. Provider   meloxicam (MOBIC) 15 MG tablet Take 1 tablet (15 mg total) by mouth daily. 30 tablet Gareth Morgan      PDMP not reviewed this encounter.   Shirlee Latch, PA-C 12/29/20 1756

## 2020-12-29 NOTE — ED Triage Notes (Signed)
Patient complains of right elbow swelling. Patient states that he noticed this last week. States that area is slightly tender, denies any known injury.
